# Patient Record
Sex: Female | Born: 1995
Health system: Southern US, Community
[De-identification: ages and names within clinical notes are randomized; demographics above are authoritative.]

## PROBLEM LIST (undated history)

## (undated) DIAGNOSIS — L709 Acne, unspecified: Secondary | ICD-10-CM

## (undated) DIAGNOSIS — F3281 Premenstrual dysphoric disorder: Secondary | ICD-10-CM

## (undated) DIAGNOSIS — N926 Irregular menstruation, unspecified: Secondary | ICD-10-CM

## (undated) HISTORY — DX: Irregular menstruation, unspecified: N92.6

## (undated) HISTORY — DX: Acne, unspecified: L70.9

## (undated) HISTORY — DX: Premenstrual dysphoric disorder: F32.81

---
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
## 2017-02-22 DIAGNOSIS — K080 Exfoliation of teeth due to systemic causes: Secondary | ICD-10-CM | POA: Diagnosis not present

## 2017-04-19 DIAGNOSIS — K080 Exfoliation of teeth due to systemic causes: Secondary | ICD-10-CM | POA: Diagnosis not present

## 2017-05-03 DIAGNOSIS — K080 Exfoliation of teeth due to systemic causes: Secondary | ICD-10-CM | POA: Diagnosis not present

## 2017-06-10 DIAGNOSIS — R0789 Other chest pain: Secondary | ICD-10-CM | POA: Diagnosis not present

## 2017-10-17 DIAGNOSIS — K080 Exfoliation of teeth due to systemic causes: Secondary | ICD-10-CM | POA: Diagnosis not present

## 2018-04-17 DIAGNOSIS — K080 Exfoliation of teeth due to systemic causes: Secondary | ICD-10-CM | POA: Diagnosis not present

## 2018-06-13 DIAGNOSIS — R42 Dizziness and giddiness: Secondary | ICD-10-CM | POA: Diagnosis not present

## 2018-06-13 DIAGNOSIS — R899 Unspecified abnormal finding in specimens from other organs, systems and tissues: Secondary | ICD-10-CM | POA: Diagnosis not present

## 2018-06-13 DIAGNOSIS — R6889 Other general symptoms and signs: Secondary | ICD-10-CM | POA: Diagnosis not present

## 2018-06-15 DIAGNOSIS — R42 Dizziness and giddiness: Secondary | ICD-10-CM | POA: Diagnosis not present

## 2018-06-27 DIAGNOSIS — F411 Generalized anxiety disorder: Secondary | ICD-10-CM | POA: Diagnosis not present

## 2018-06-27 DIAGNOSIS — F4011 Social phobia, generalized: Secondary | ICD-10-CM | POA: Diagnosis not present

## 2018-07-03 DIAGNOSIS — F419 Anxiety disorder, unspecified: Secondary | ICD-10-CM | POA: Diagnosis not present

## 2018-07-04 DIAGNOSIS — F419 Anxiety disorder, unspecified: Secondary | ICD-10-CM | POA: Diagnosis not present

## 2018-11-30 DIAGNOSIS — F4001 Agoraphobia with panic disorder: Secondary | ICD-10-CM | POA: Diagnosis not present

## 2018-12-08 DIAGNOSIS — F4001 Agoraphobia with panic disorder: Secondary | ICD-10-CM | POA: Diagnosis not present

## 2018-12-19 DIAGNOSIS — R112 Nausea with vomiting, unspecified: Secondary | ICD-10-CM | POA: Diagnosis not present

## 2018-12-21 LAB — BASIC METABOLIC PANEL
BUN: 15 (ref 4–21)
Creatinine: 0.6 (ref 0.5–1.1)
Glucose: 128
Potassium: 4.2 (ref 3.4–5.3)
Sodium: 142 (ref 137–147)

## 2018-12-27 DIAGNOSIS — F4001 Agoraphobia with panic disorder: Secondary | ICD-10-CM | POA: Diagnosis not present

## 2018-12-28 DIAGNOSIS — Z30011 Encounter for initial prescription of contraceptive pills: Secondary | ICD-10-CM | POA: Diagnosis not present

## 2018-12-28 DIAGNOSIS — F3281 Premenstrual dysphoric disorder: Secondary | ICD-10-CM | POA: Diagnosis not present

## 2019-01-03 ENCOUNTER — Encounter

## 2019-01-04 ENCOUNTER — Ambulatory Visit: Payer: Self-pay | Admitting: Family Medicine

## 2019-01-08 ENCOUNTER — Other Ambulatory Visit: Payer: Self-pay | Admitting: Gastroenterology

## 2019-01-08 DIAGNOSIS — R112 Nausea with vomiting, unspecified: Secondary | ICD-10-CM

## 2019-01-11 ENCOUNTER — Other Ambulatory Visit: Payer: Self-pay

## 2019-01-11 ENCOUNTER — Ambulatory Visit
Admission: RE | Admit: 2019-01-11 | Discharge: 2019-01-11 | Disposition: A | Discharge status: Home / Self Care | Payer: BLUE CROSS/BLUE SHIELD | Source: Ambulatory Visit | Attending: Gastroenterology | Admitting: Gastroenterology

## 2019-01-11 DIAGNOSIS — R112 Nausea with vomiting, unspecified: Secondary | ICD-10-CM | POA: Diagnosis not present

## 2019-01-24 DIAGNOSIS — Z3041 Encounter for surveillance of contraceptive pills: Secondary | ICD-10-CM | POA: Diagnosis not present

## 2019-01-24 DIAGNOSIS — F3281 Premenstrual dysphoric disorder: Secondary | ICD-10-CM | POA: Diagnosis not present

## 2019-02-15 ENCOUNTER — Ambulatory Visit: Payer: Self-pay | Admitting: Family Medicine

## 2019-02-19 ENCOUNTER — Encounter: Payer: Self-pay | Admitting: Family Medicine

## 2019-02-19 ENCOUNTER — Other Ambulatory Visit: Payer: Self-pay

## 2019-02-19 ENCOUNTER — Ambulatory Visit (INDEPENDENT_AMBULATORY_CARE_PROVIDER_SITE_OTHER): Payer: BLUE CROSS/BLUE SHIELD | Admitting: Family Medicine

## 2019-02-19 DIAGNOSIS — L709 Acne, unspecified: Secondary | ICD-10-CM | POA: Insufficient documentation

## 2019-02-19 DIAGNOSIS — L700 Acne vulgaris: Secondary | ICD-10-CM

## 2019-02-19 DIAGNOSIS — R5383 Other fatigue: Secondary | ICD-10-CM

## 2019-02-19 DIAGNOSIS — N926 Irregular menstruation, unspecified: Secondary | ICD-10-CM | POA: Insufficient documentation

## 2019-02-19 DIAGNOSIS — F3281 Premenstrual dysphoric disorder: Secondary | ICD-10-CM | POA: Diagnosis not present

## 2019-02-19 NOTE — Assessment & Plan Note (Signed)
Monitor for improvement on OCPs. Not bothersome to patient at this time. Consider PCOS eval; get gyn records. (denies hirsutism).

## 2019-02-19 NOTE — Assessment & Plan Note (Signed)
Improving on ocps and buspar. Will reassess in 4-6 weeks. Consider SSRI if needed.

## 2019-02-19 NOTE — Patient Instructions (Addendum)
Please return in 4-5 weeks for cpe and follow up.  It was a pleasure meeting you today! Thank you for choosing [place] to meet your healthcare needs! I truly look forward to working with you. If you have any questions or concerns, please send me a message via Mychart or call the office at [phone].

## 2019-02-19 NOTE — Progress Notes (Signed)
Virtual Visit via Video Note  Subjective  CC:  Chief Complaint  Patient presents with   Establish Care   Fatigue     I connected with Vonzella Nipple on 02/19/19 at  1:00 PM EDT by a video enabled telemedicine application and verified that I am speaking with the correct person using two identifiers. Location patient: Home Location provider: [facility] [address] Office Persons participating in the virtual visit: Marchella Albee, Willow Ora, MD Rita Ohara, CMA  I discussed the limitations of evaluation and management by telemedicine and the availability of in person appointments. The patient expressed understanding and agreed to proceed. HPI: Eliannie Geisel is a 23 y.o. female who was contacted today to address the problems listed above in the chief complaint. 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 Alita Chyle for pediatrician. Overall healthy, but over the last several months, has had increased n/v and anxiety sxs. Reports had mild sxs when she was in high school.  However she never sought treatment at that time.  More recently, she went [facility] the college clinic and the provider there diagnosed her with anxiety and prescribed BuSpar.  She is on BuSpar 10 mg twice a day and feels that it is mildly helpful.  However she is also seeing her gynecologist who feels that her symptoms are more consistent with PMDD.  She has been started on a birth control pill.  For the last 6 to 8 weeks she feels that her symptoms are mildly improved.  However the gynecologist would like her to wait a full 12-week before making any other changes.  She denies depressive symptoms.  No history of anxiety or panic attacks.  Mostly, she gets nervous and gets a nervous stomach with episodes of vomiting associated with her anxiety.  She is a [employer].  She reports a healthy home life.  She has a good  family and social network.  She denies specific stressors or triggers.  There is no family history of bipolar or depressive or anxiety symptoms.  She has never been on an SSRI for anxiety.  She has had a full gastroenterologic evaluation.  She was started on high-dose Nexium in March to see if this helps.  However she feels more strongly that her nausea and vomiting are related to anxiety and not GERD or GI related symptoms.  Health maintenance: She is due for physical exam.  She relates her immunization history is up-to-date.  We do not have those records at this time.  Depression screen [facility] 2/9 02/19/2019  Decreased Interest 0  Down, Depressed, Hopeless 0  PHQ - 2 Score 0  Altered sleeping 0  Tired, decreased energy 3  Change in appetite 0  Feeling bad or failure about yourself  0  Trouble concentrating 1  Moving slowly or fidgety/restless 0  Suicidal thoughts 0  PHQ-9 Score 4  Difficult doing work/chores Not difficult at all   GAD 7 : Generalized Anxiety Score 02/19/2019  Nervous, Anxious, on Edge 1  Control/stop worrying 2  Worry too much - different things 2  Trouble relaxing 0  Restless 0  Easily annoyed or irritable 1  Afraid - awful might happen 0  Total GAD 7 Score 6  Anxiety Difficulty Not difficult at all     Assessment  1. PMDD (premenstrual dysphoric disorder)   2. Fatigue,  unspecified type   3. Acne vulgaris   4. Irregular menstrual cycle      Plan   fatigue:  Vague; may be related to mood. Will check lab work at next visit along with physical exam and monitor once mood is well controlled. Further eval if persists. No clear etiology at this point.   Today's visit was 30 minutes long. Greater than 50% of this time was devoted to face to face counseling with the patient and coordination of care. We discussed her diagnosis, prognosis, treatment options and treatment plan is documented above and below.  See below for problem based assessment and plan documentation.    I discussed the assessment and treatment plan with the patient. The patient was provided an opportunity to ask questions and all were answered. The patient agreed with the plan and demonstrated an understanding of the instructions.   The patient was advised to call back or seek an in-person evaluation if the symptoms worsen or if the condition fails to improve as anticipated. Follow up: Return in about 4 weeks (around 03/19/2019) for complete physical, mood follow up.  Visit date not found  No orders of the defined types were placed in this encounter.     I reviewed the patients updated PMH, FH, and SocHx.    Patient Active Problem List   Diagnosis Date Noted   Acne    Irregular menstrual cycle    PMDD (premenstrual dysphoric disorder)    No outpatient medications have been marked as taking for the 02/19/19 encounter (Office Visit) with Willow OraAndy, Arlyne Brandes L, MD.    Allergies: Patient has No Known Allergies. Family History: Patient family history includes High Cholesterol in her father, maternal grandmother, and mother. Social History:  Patient  reports that she has never smoked. She has never used smokeless tobacco. She reports that she does not drink alcohol or use drugs.  Review of Systems: Constitutional: Negative for fever malaise or anorexia Cardiovascular: negative for chest pain Respiratory: negative for SOB or persistent cough Gastrointestinal: negative for abdominal pain Endocrine: negative hirsutism.   OBJECTIVE Vitals: There were no vitals taken for this visit. reports afebrile General: no acute distress , A&Ox3 Psych: normal speech and affect.  Skin: facial acne  Willow Oraamille L Akiko Schexnider, MD

## 2019-02-19 NOTE — Assessment & Plan Note (Signed)
Improving on ocps. Has GYN f/u. No AEs.

## 2019-03-19 ENCOUNTER — Ambulatory Visit: Payer: BLUE CROSS/BLUE SHIELD | Admitting: Family Medicine

## 2019-03-22 ENCOUNTER — Ambulatory Visit: Payer: BC Managed Care – PPO | Admitting: Family Medicine

## 2019-03-30 DIAGNOSIS — F3281 Premenstrual dysphoric disorder: Secondary | ICD-10-CM | POA: Diagnosis not present

## 2019-05-17 ENCOUNTER — Encounter: Payer: BLUE CROSS/BLUE SHIELD | Admitting: Family Medicine

## 2019-06-05 DIAGNOSIS — L718 Other rosacea: Secondary | ICD-10-CM | POA: Diagnosis not present

## 2019-06-05 DIAGNOSIS — L239 Allergic contact dermatitis, unspecified cause: Secondary | ICD-10-CM | POA: Diagnosis not present

## 2019-10-16 DIAGNOSIS — K582 Mixed irritable bowel syndrome: Secondary | ICD-10-CM | POA: Diagnosis not present

## 2019-10-16 DIAGNOSIS — R112 Nausea with vomiting, unspecified: Secondary | ICD-10-CM | POA: Diagnosis not present

## 2019-10-16 DIAGNOSIS — F411 Generalized anxiety disorder: Secondary | ICD-10-CM | POA: Diagnosis not present

## 2019-11-21 IMAGING — US ULTRASOUND ABDOMEN LIMITED
1 series · 14 of 25 positions shown · non-contrast
Comparison: None.

CLINICAL DATA: Nausea and vomiting over the last several weeks.

EXAM:
ULTRASOUND ABDOMEN LIMITED RIGHT UPPER QUADRANT

[Series 1: ultrasound abdomen limited · 0.19mm/px · 14 of 41 slices shown]
[im 1/41]
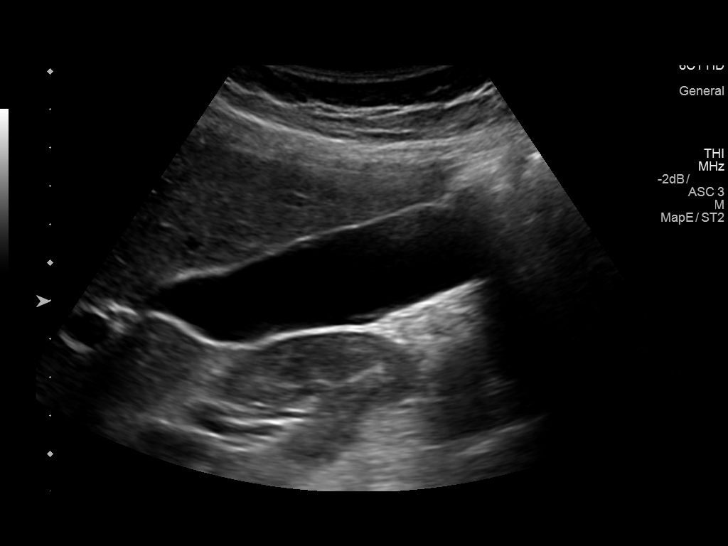
[im 4/41]
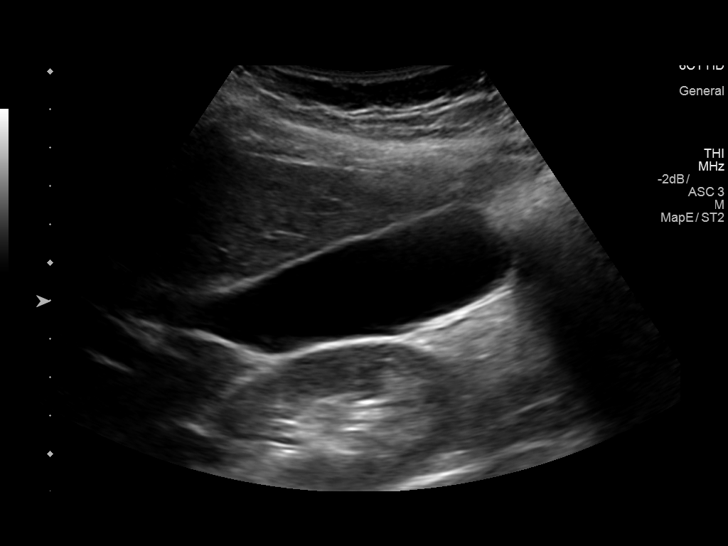
[im 7/41]
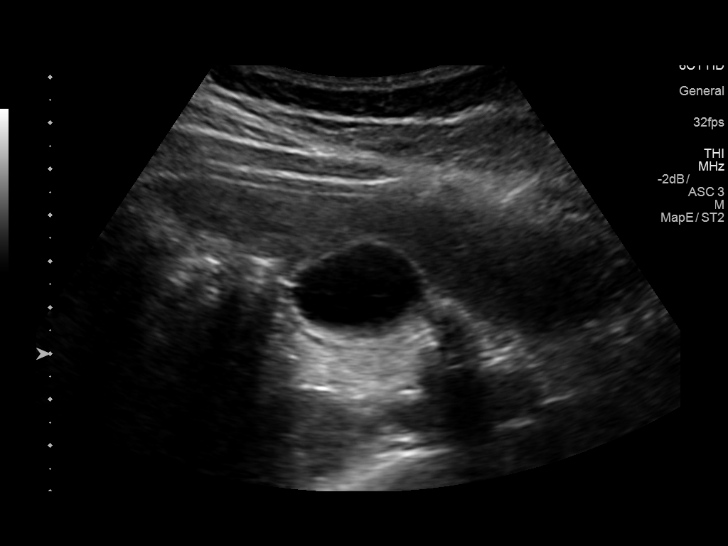
[im 11/41]
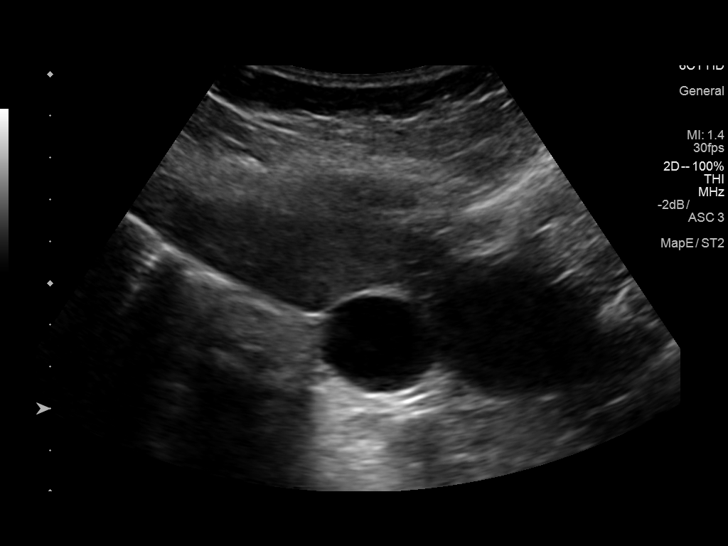
[im 14/41]
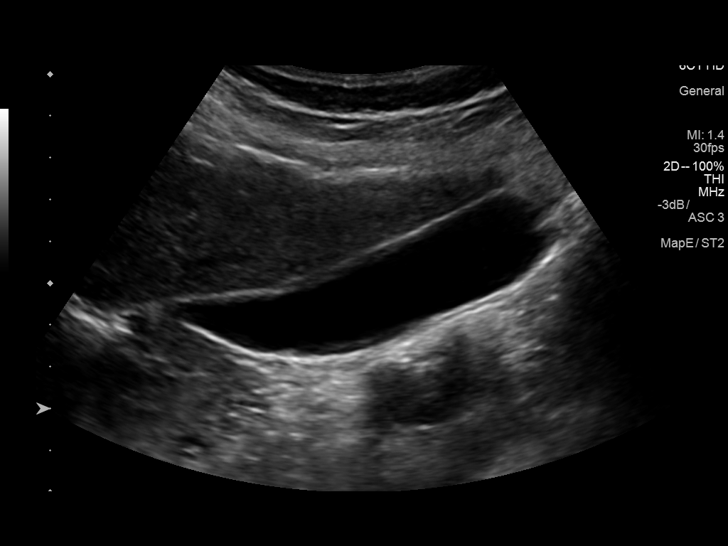
[im 16/41]
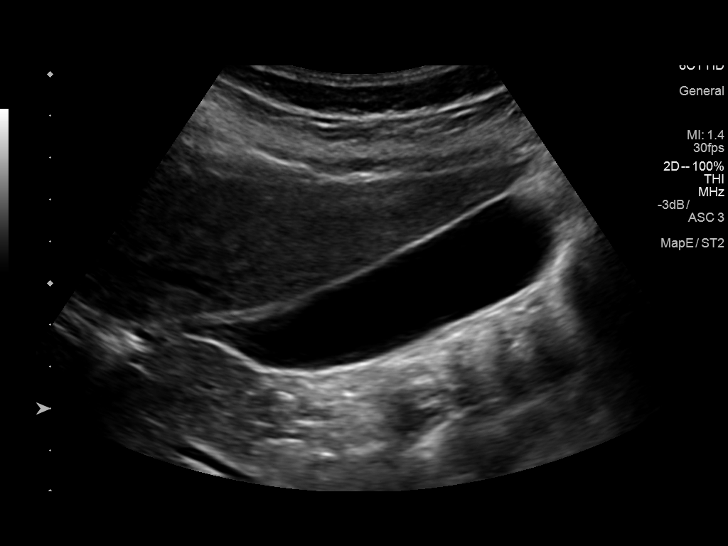
[im 19/41]
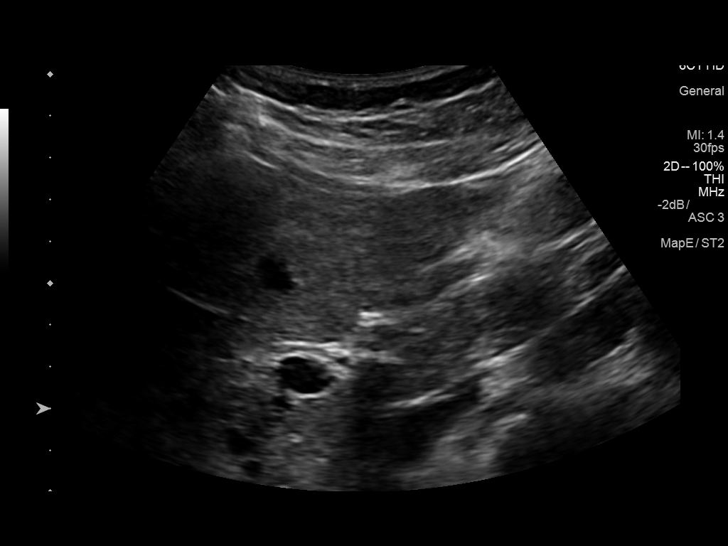
[im 22/41]
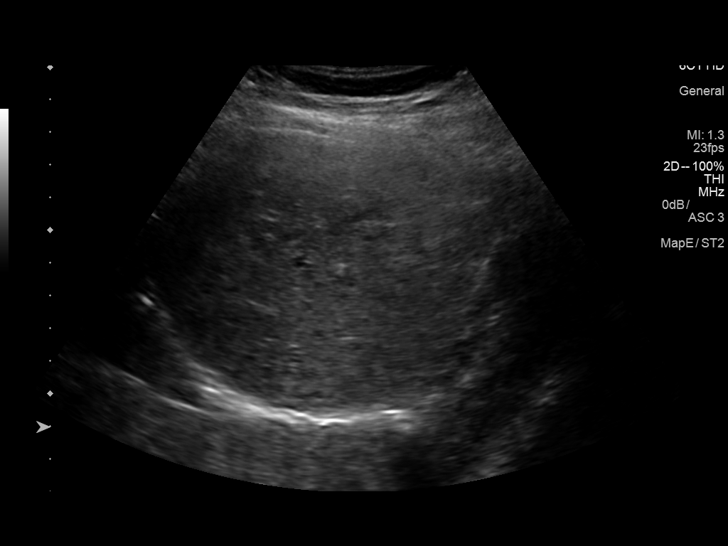
[im 26/41]
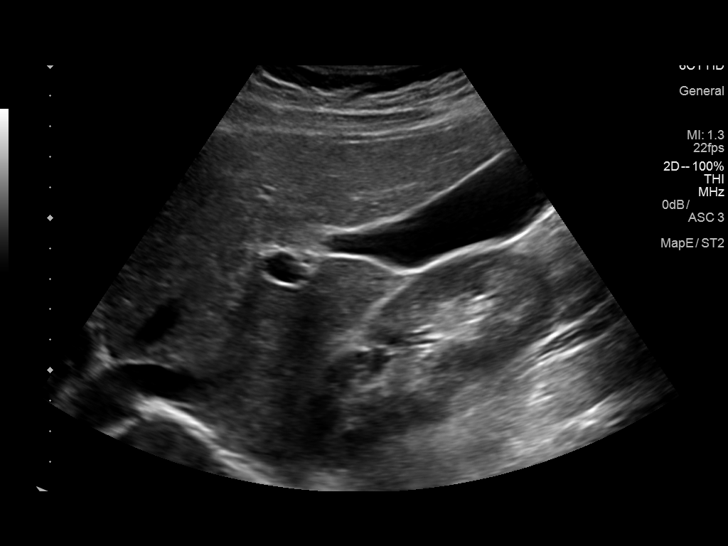
[im 27/41]
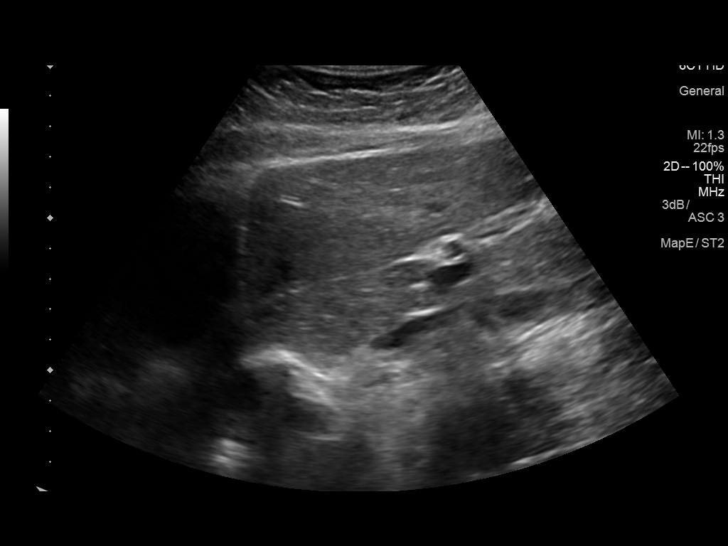
[im 31/41]
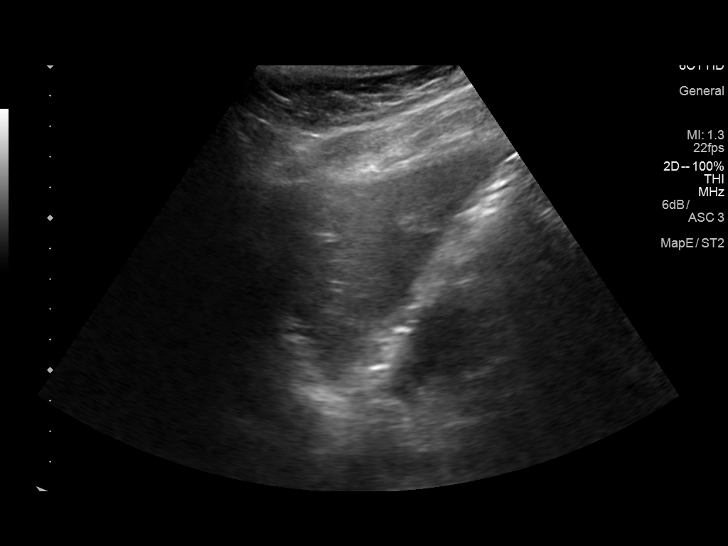
[im 34/41]
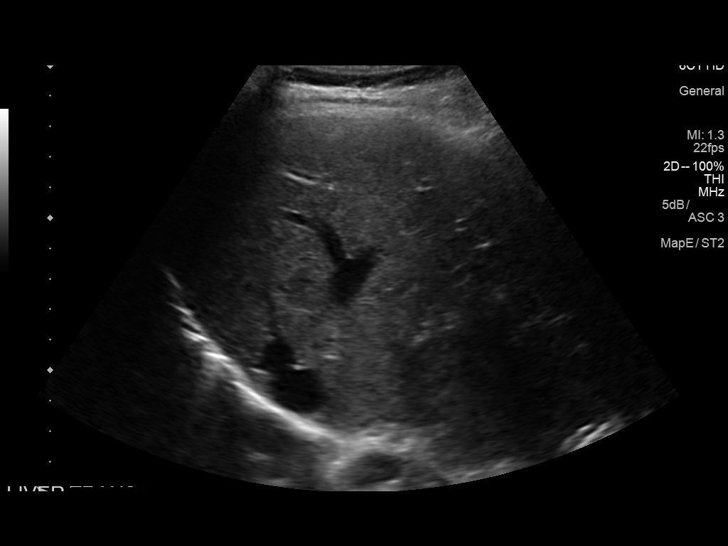
[im 37/41]
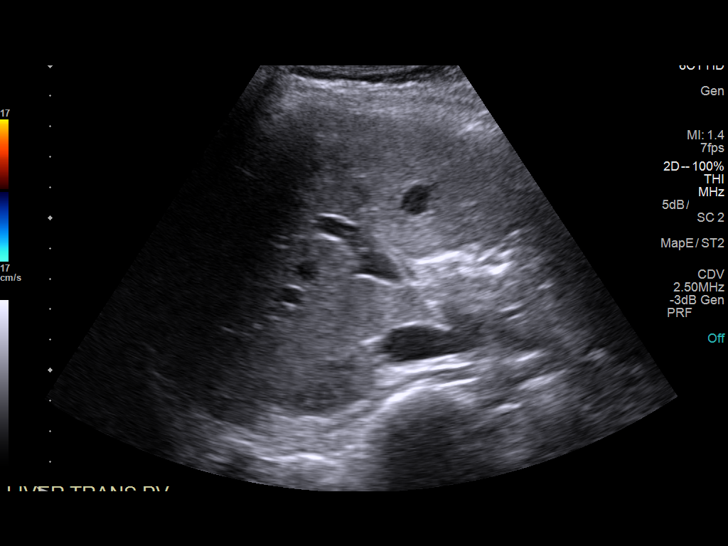
[im 41/41]
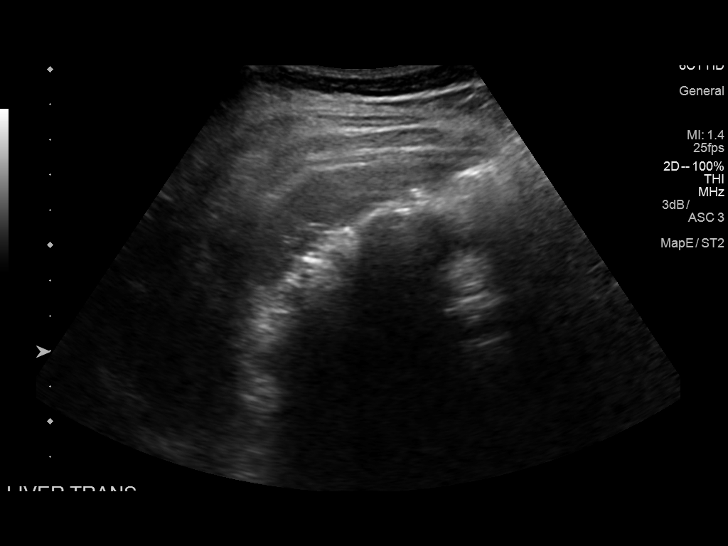

[14 of 25 positions shown; findings below may reference images not displayed]

FINDINGS: Gallbladder:

No gallstones or wall thickening visualized. No sonographic Murphy
sign noted by sonographer.

Common bile duct:

Diameter: 1.5 mm.  Normal.

Liver:

No focal lesion identified. Within normal limits in parenchymal
echogenicity. Portal vein is patent on color Doppler imaging with
normal direction of blood flow towards the liver.
IMPRESSION: Normal right upper quadrant ultrasound. No evidence of hepatobiliary
disease as a cause of the presenting symptoms.

## 2020-01-11 DIAGNOSIS — K582 Mixed irritable bowel syndrome: Secondary | ICD-10-CM | POA: Diagnosis not present

## 2020-01-11 DIAGNOSIS — R112 Nausea with vomiting, unspecified: Secondary | ICD-10-CM | POA: Diagnosis not present

## 2020-01-11 DIAGNOSIS — F411 Generalized anxiety disorder: Secondary | ICD-10-CM | POA: Diagnosis not present

## 2020-04-04 DIAGNOSIS — Z01419 Encounter for gynecological examination (general) (routine) without abnormal findings: Secondary | ICD-10-CM | POA: Diagnosis not present

## 2020-04-04 DIAGNOSIS — Z113 Encounter for screening for infections with a predominantly sexual mode of transmission: Secondary | ICD-10-CM | POA: Diagnosis not present

## 2021-01-17 ENCOUNTER — Emergency Department (HOSPITAL_COMMUNITY): Payer: BC Managed Care – PPO

## 2021-01-17 ENCOUNTER — Encounter (HOSPITAL_COMMUNITY): Payer: Self-pay

## 2021-01-17 ENCOUNTER — Emergency Department (HOSPITAL_COMMUNITY)
Admission: EM | Admit: 2021-01-17 | Discharge: 2021-01-17 | Disposition: A | Discharge status: Home / Self Care | Payer: BC Managed Care – PPO | Attending: Emergency Medicine | Admitting: Emergency Medicine

## 2021-01-17 ENCOUNTER — Other Ambulatory Visit: Payer: Self-pay

## 2021-01-17 DIAGNOSIS — E876 Hypokalemia: Secondary | ICD-10-CM | POA: Insufficient documentation

## 2021-01-17 DIAGNOSIS — R112 Nausea with vomiting, unspecified: Secondary | ICD-10-CM | POA: Insufficient documentation

## 2021-01-17 DIAGNOSIS — R102 Pelvic and perineal pain: Secondary | ICD-10-CM | POA: Diagnosis not present

## 2021-01-17 DIAGNOSIS — M545 Low back pain, unspecified: Secondary | ICD-10-CM | POA: Diagnosis not present

## 2021-01-17 DIAGNOSIS — R1031 Right lower quadrant pain: Secondary | ICD-10-CM | POA: Diagnosis not present

## 2021-01-17 DIAGNOSIS — R109 Unspecified abdominal pain: Secondary | ICD-10-CM

## 2021-01-17 LAB — COMPREHENSIVE METABOLIC PANEL
ALT: 15 U/L (ref 0–44)
AST: 17 U/L (ref 15–41)
Albumin: 3.8 g/dL (ref 3.5–5.0)
Alkaline Phosphatase: 33 U/L — ABNORMAL LOW (ref 38–126)
Anion gap: 10 (ref 5–15)
BUN: 12 mg/dL (ref 6–20)
CO2: 22 mmol/L (ref 22–32)
Calcium: 9 mg/dL (ref 8.9–10.3)
Chloride: 103 mmol/L (ref 98–111)
Creatinine, Ser: 0.66 mg/dL (ref 0.44–1.00)
GFR, Estimated: >60 mL/min (ref >60)
Glucose, Bld: 107 mg/dL — ABNORMAL HIGH (ref 70–99)
Potassium: 3.3 mmol/L — ABNORMAL LOW (ref 3.5–5.1)
Sodium: 135 mmol/L (ref 135–145)
Total Bilirubin: 0.2 mg/dL — ABNORMAL LOW (ref 0.3–1.2)
Total Protein: 6.6 g/dL (ref 6.5–8.1)

## 2021-01-17 LAB — CBC
HCT: 37.7 % (ref 36.0–46.0)
Hemoglobin: 12.6 g/dL (ref 12.0–15.0)
MCH: 30 pg (ref 26.0–34.0)
MCHC: 33.4 g/dL (ref 30.0–36.0)
MCV: 89.8 fL (ref 80.0–100.0)
Platelets: 254 10*3/uL (ref 150–400)
RBC: 4.2 MIL/uL (ref 3.87–5.11)
RDW: 12.4 % (ref 11.5–15.5)
WBC: 7.6 10*3/uL (ref 4.0–10.5)
nRBC: 0 % (ref 0.0–0.2)

## 2021-01-17 LAB — URINALYSIS, ROUTINE W REFLEX MICROSCOPIC
Bacteria, UA: NONE SEEN
Bilirubin Urine: NEGATIVE
Glucose, UA: NEGATIVE mg/dL
Ketones, ur: NEGATIVE mg/dL
Leukocytes,Ua: NEGATIVE
Nitrite: NEGATIVE
Protein, ur: NEGATIVE mg/dL
Specific Gravity, Urine: 1.01 (ref 1.005–1.030)
pH: 8 (ref 5.0–8.0)

## 2021-01-17 LAB — I-STAT BETA HCG BLOOD, ED (MC, WL, AP ONLY): I-stat hCG, quantitative: <5 m[IU]/mL (ref <5)

## 2021-01-17 LAB — LIPASE, BLOOD: Lipase: 36 U/L (ref 11–51)

## 2021-01-17 NOTE — ED Triage Notes (Signed)
Pt reports pain to right flank and right lower abdomen. Reports vomited x 2. Started at 0430 am. Denies urinary frequency or burning with urination.

## 2021-01-17 NOTE — ED Provider Notes (Signed)
[facility]   CSN: [id number] Arrival date & time: 01/17/21  0449     History No chief complaint on file.   Brianna ShanksMaris Jodelle Singh is a 25 y.o. female.  Patient presents to the emergency department today for evaluation of right lower quadrant and right lower back pain.  Patient woke from sleep at approximately 4:30 AM with severe pain in her side.  She was extremely uncomfortable, pain was severe, and she vomited twice.  She woke her mother who accompanies her today.  Patient states that about an hour ago her symptoms have resolved, except for a few very brief twinges of pain.  No urinary changes or symptoms.  No vaginal bleeding or discharge.  No chest pain, fever or shortness of breath.  Patient has never had a kidney stone or any surgeries on her abdomen.  No treatments prior to arrival.  The pain was so severe she did think Tylenol would help. The onset of this condition was acute. The course is constant. Aggravating factors: none. Alleviating factors: none.          Past Medical History:  Diagnosis Date  . Acne   . Irregular menstrual cycle   . PMDD (premenstrual dysphoric disorder)     Patient Active Problem List   Diagnosis Date Noted  . Acne   . Irregular menstrual cycle   . PMDD (premenstrual dysphoric disorder)     History reviewed. No pertinent surgical history.   OB History   No obstetric history on file.     Family History  Problem Relation Age of Onset  . High Cholesterol Mother   . High Cholesterol Father   . Hypertension Maternal Grandmother   . Hypertension Maternal Grandfather   . Depression Neg Hx   . Bipolar disorder Neg Hx     Social History   Tobacco Use  . Smoking status: Never Smoker  . Smokeless tobacco: Never Used  Vaping Use  . Vaping Use: Never used  Substance Use Topics  . Alcohol use: Never  . Drug use: Never    Home Medications Prior to Admission medications   Medication Sig  Start Date End Date Taking? Authorizing Provider  busPIRone (BUSPAR) 10 MG tablet TAKE 1 TABLET EVERY AM & 1/2 TABLET IN THE EVENING FOR 1 WEEK, THEN INCREASE TO 1 TABLET TWICE DAILY 01/12/19   [provider]  esomeprazole (NEXIUM) 40 MG capsule TAKE 1 CAPSULE BY MOUTH EVERY DAY AS DIRECTED 02/03/19   [provider]  LO LOESTRIN FE 1 MG-10 MCG / 10 MCG tablet Take 1 tablet by mouth daily. 12/28/18   [provider]    Allergies    Other  Review of Systems   Review of Systems  Constitutional: Negative for fever.  HENT: Negative for rhinorrhea and sore throat.   Eyes: Negative for redness.  Respiratory: Negative for cough.   Cardiovascular: Negative for chest pain.  Gastrointestinal: Positive for abdominal pain, nausea and vomiting. Negative for diarrhea.  Genitourinary: Positive for flank pain. Negative for dysuria, frequency, hematuria and urgency.  Musculoskeletal: Negative for myalgias.  Skin: Negative for rash.  Neurological: Negative for headaches.    Physical Exam Updated Vital Signs BP (!) 132/91   Pulse 81   Temp 97.6 F (36.4 C) (Oral)   Resp 17   Ht 5\' 5"  (1.651 m)   Wt 59 kg   LMP 01/03/2021 (Approximate)   SpO2 100%   BMI 21.63 kg/m   Physical  Exam Vitals and nursing note reviewed.  Constitutional:      General: She is not in acute distress.    Appearance: She is well-developed.  HENT:     Head: Normocephalic and atraumatic.     Right Ear: External ear normal.     Left Ear: External ear normal.     Nose: Nose normal.  Eyes:     Conjunctiva/sclera: Conjunctivae normal.  Cardiovascular:     Rate and Rhythm: Normal rate and regular rhythm.     Heart sounds: No murmur heard.   Pulmonary:     Effort: No respiratory distress.     Breath sounds: No wheezing, rhonchi or rales.  Abdominal:     Palpations: Abdomen is soft.     Tenderness: There is no abdominal tenderness. There is right CVA tenderness (mild). There is no guarding or  rebound.     Comments: Patient reports symptoms have resolved.  No abdominal pain at time of exam.  Musculoskeletal:     Cervical back: Normal range of motion and neck supple.     Right lower leg: No edema.     Left lower leg: No edema.  Skin:    General: Skin is warm and dry.     Findings: No rash.  Neurological:     General: No focal deficit present.     Mental Status: She is alert. Mental status is at baseline.     Motor: No weakness.  Psychiatric:        Mood and Affect: Mood normal.     ED Results / Procedures / Treatments   Labs (all labs ordered are listed, but only abnormal results are displayed) Labs Reviewed  COMPREHENSIVE METABOLIC PANEL - Abnormal; Notable for the following components:      Result Value   Potassium 3.3 (*)    Glucose, Bld 107 (*)    Alkaline Phosphatase 33 (*)    Total Bilirubin 0.2 (*)    All other components within normal limits  URINALYSIS, ROUTINE W REFLEX MICROSCOPIC - Abnormal; Notable for the following components:   APPearance HAZY (*)    Hgb urine dipstick LARGE (*)    All other components within normal limits  LIPASE, BLOOD  CBC  I-STAT BETA HCG BLOOD, ED ([facility], [facility], [facility] ONLY)    EKG None  Radiology US Pelvis Complete  Result Date: 01/17/2021 CLINICAL DATA:  Pelvic pain EXAM: TRANSABDOMINAL ULTRASOUND OF PELVIS DOPPLER ULTRASOUND OF OVARIES TECHNIQUE: Transabdominal ultrasound examination of the pelvis was performed including evaluation of the uterus, ovaries, adnexal regions, and pelvic cul-de-sac. Patient declined transvaginal study. Color and duplex Doppler ultrasound was utilized to evaluate blood flow to the ovaries. COMPARISON:  None. FINDINGS: Uterus Measurements: 7.7 x 2.7 x 4.1 cm = volume: 44.5 mL. No fibroids or other mass visualized. Endometrium Thickness: 5 mm.  No focal abnormality visualized. Right ovary Measurements: 3.7 x 1.8 x 2.1 cm = volume: 7.1 mL. Normal appearance/no adnexal mass. Left ovary Measurements: 2.5 x 1.4 x  2.3 cm = volume: 4.9 mL. Normal appearance/no adnexal mass. Pulsed Doppler evaluation demonstrates normal low-resistance arterial and venous waveforms in both ovaries. Other: No evident free fluid. IMPRESSION: No intrauterine or extrauterine pelvic or adnexal mass. No appreciable free fluid. Low resistance waveform in each ovary. No findings indicative of ovarian torsion on either side. Electronically Signed   By: Bretta Bang III M.D.   On: 01/17/2021 10:18   [place] Art/Ven Flow Abd Pelv Doppler  Result Date: 01/17/2021 CLINICAL DATA:  Pelvic pain EXAM: TRANSABDOMINAL ULTRASOUND OF PELVIS DOPPLER ULTRASOUND OF OVARIES TECHNIQUE: Transabdominal ultrasound examination of the pelvis was performed including evaluation of the uterus, ovaries, adnexal regions, and pelvic cul-de-sac. Patient declined transvaginal study. Color and duplex Doppler ultrasound was utilized to evaluate blood flow to the ovaries. COMPARISON:  None. FINDINGS: Uterus Measurements: 7.7 x 2.7 x 4.1 cm = volume: 44.5 mL. No fibroids or other mass visualized. Endometrium Thickness: 5 mm.  No focal abnormality visualized. Right ovary Measurements: 3.7 x 1.8 x 2.1 cm = volume: 7.1 mL. Normal appearance/no adnexal mass. Left ovary Measurements: 2.5 x 1.4 x 2.3 cm = volume: 4.9 mL. Normal appearance/no adnexal mass. Pulsed Doppler evaluation demonstrates normal low-resistance arterial and venous waveforms in both ovaries. Other: No evident free fluid. IMPRESSION: No intrauterine or extrauterine pelvic or adnexal mass. No appreciable free fluid. Low resistance waveform in each ovary. No findings indicative of ovarian torsion on either side. Electronically Signed   By: Bretta Bang III M.D.   On: 01/17/2021 10:18    Procedures Procedures   Medications Ordered in ED Medications - No data to display  ED Course  I have reviewed the triage vital signs and the nursing notes.  Pertinent labs & imaging results that were available during my  care of the patient were reviewed by me and considered in my medical decision making (see chart for details).  Patient seen and examined. Work-up initiated.  Symptoms are much better controlled at this time, spontaneously.  Considered ureteral colic, ovarian cyst rupture, ovarian torsion is most likely possibilities.  Also considered appendicitis and musculoskeletal pain however felt less likely.  Vital signs reviewed and are as follows: BP (!) 132/91   Pulse 81   Temp 97.6 F (36.4 C) (Oral)   Resp 17   Ht 5\' 5"  (1.651 m)   Wt 59 kg   LMP 01/03/2021 (Approximate)   SpO2 100%   BMI 21.63 kg/m   8:55 AM Blood in UA -- however patient reports some breakthrough vaginal bleeding noticed when she gave urine sample. So unclear etiology of bleeding.   Discussed options at this point.  Her pain remains resolved.  Discussed pelvic ultrasound to evaluate for signs of ovarian cyst and rule out current torsion, although with out pain currently, torsion is unlikely.  Also discussed symptom control and watchful waiting and follow-up, with possible need to return if symptoms recur.  Patient elects to proceed with ultrasonography.  10:31 AM patient continues to be pain-free and looks very comfortable.  Updated patient and mother on results of ultrasonography.  At this point, feel that she is stable for discharge to home.  Will be given a strainer to use for a few days to catch any possible sediment.  Encourage PCP follow-up next week, especially if pain is recurrent or persist.  Encouraged use of Tylenol or NSAIDs for pain.  The patient was urged to return to the Emergency Department immediately with worsening of current symptoms, worsening abdominal pain, persistent vomiting, blood noted in stools, fever, or any other concerns. The patient verbalized understanding.     MDM Rules/Calculators/A&P                          Patient presents today after having an acute onset of right lower abdominal pain.   Symptoms were severe and associated with vomiting.  Fortunately lab work-up is reassuring with only mild hypokalemia.  Normal white blood cell count.  Urine with blood,  but possibly contaminated.  No signs of infection and symptoms would be unusual for pyelonephritis.  Evaluated for signs of ovarian cyst or torsion, imaging negative.  Other potential etiologies include ureteral colic, intestinal spasm, musculoskeletal pain.  At this point, no indication of a condition which require admission to the hospital.  Patient looks well, pain resolved.  She is comfortable with close monitoring at home and follow-up as needed.  Return instructions as above.   Final Clinical Impression(s) / ED Diagnoses Final diagnoses:  Right sided abdominal pain    Rx / DC Orders ED Discharge Orders    None       Renne Crigler, PA-C 01/17/21 1035    Margarita Grizzle, MD 01/17/21 [phone]

## 2021-01-17 NOTE — Discharge Instructions (Signed)
Please read and follow all provided instructions.  Your diagnoses today include:  1. Right sided abdominal pain     Tests performed today include:  Blood cell counts and platelets  Kidney and liver function tests - slightly low potassium  Urine test to look for infection - no sign of infection, there is blood but unclear if this is contamination or if it is a sign of a kidney stone  A blood or urine test for pregnancy (women only)  Vital signs. See below for your results today.   Medications prescribed:  Please use over-the-counter NSAID medications (ibuprofen, naproxen) as directed on the packaging for pain if you do not have any reasons not to take these medications just as weak kidneys or a history of bleeding in your stomach or gut.   Take any prescribed medications only as directed.  Home care instructions:   Follow any educational materials contained in this packet.  Follow-up instructions: Please follow-up with your primary care provider in the next 3 days for further evaluation of your symptoms.    Return instructions:  SEEK IMMEDIATE MEDICAL ATTENTION IF:  The pain does not go away or becomes severe   A temperature above 101F develops   Repeated vomiting occurs (multiple episodes)   The pain becomes localized to portions of the abdomen. The right side could possibly be appendicitis. In an adult, the left lower portion of the abdomen could be colitis or diverticulitis.   Blood is being passed in stools or vomit (bright red or black tarry stools)   You develop chest pain, difficulty breathing, dizziness or fainting, or become confused, poorly responsive, or inconsolable (young children)  If you have any other emergent concerns regarding your health  Additional Information: Abdominal (belly) pain can be caused by many things. Your caregiver performed an examination and possibly ordered blood/urine tests and imaging (CT scan, x-rays, ultrasound). Many cases can be  observed and treated at home after initial evaluation in the emergency department. Even though you are being discharged home, abdominal pain can be unpredictable. Therefore, you need a repeated exam if your pain does not resolve, returns, or worsens. Most patients with abdominal pain don't have to be admitted to the hospital or have surgery, but serious problems like appendicitis and gallbladder attacks can start out as nonspecific pain. Many abdominal conditions cannot be diagnosed in one visit, so follow-up evaluations are very important.  Your vital signs today were: BP (!) 127/100   Pulse 84   Temp 97.6 F (36.4 C) (Oral)   Resp 17   Ht 5\' 5"  (1.651 m)   Wt 59 kg   LMP 01/03/2021 (Approximate)   SpO2 99%   BMI 21.63 kg/m  If your blood pressure (bp) was elevated above 135/85 this visit, please have this repeated by your doctor within one month. --------------

## 2021-01-17 NOTE — ED Notes (Signed)
Pt currently drinking water at this time.

## 2021-01-17 NOTE — ED Notes (Signed)
Patient transported to Ultrasound 

## 2021-04-07 DIAGNOSIS — Z01419 Encounter for gynecological examination (general) (routine) without abnormal findings: Secondary | ICD-10-CM | POA: Diagnosis not present

## 2021-04-07 DIAGNOSIS — Z113 Encounter for screening for infections with a predominantly sexual mode of transmission: Secondary | ICD-10-CM | POA: Diagnosis not present

## 2021-10-22 IMAGING — US US PELVIS COMPLETE
1 series · 14 of 25 positions shown · non-contrast
Comparison: None.

CLINICAL DATA: Pelvic pain

EXAM:
TRANSABDOMINAL ULTRASOUND OF PELVIS
DOPPLER ULTRASOUND OF OVARIES
TECHNIQUE: Transabdominal ultrasound examination of the pelvis was performed
including evaluation of the uterus, ovaries, adnexal regions, and
pelvic cul-de-sac. Patient declined transvaginal study.
Color and duplex Doppler ultrasound was utilized to evaluate blood
flow to the ovaries.

[Series 1: us pelvis (transabdominal only) · 14 of 43 slices shown]
[im 1/43]
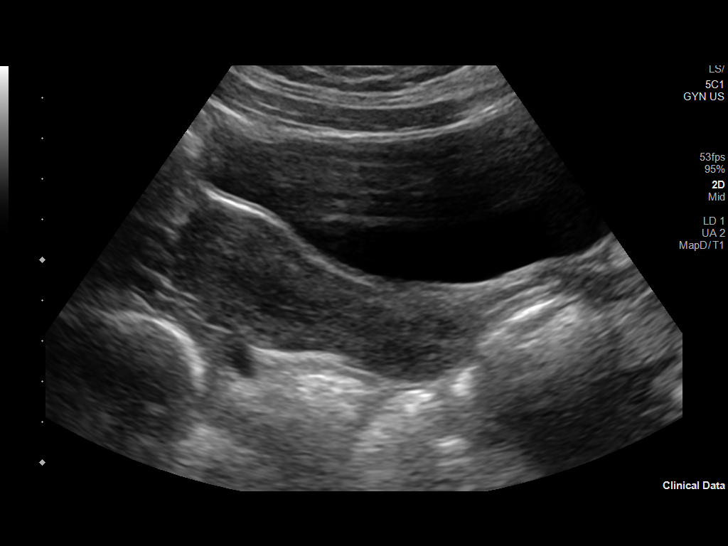
[im 4/43]
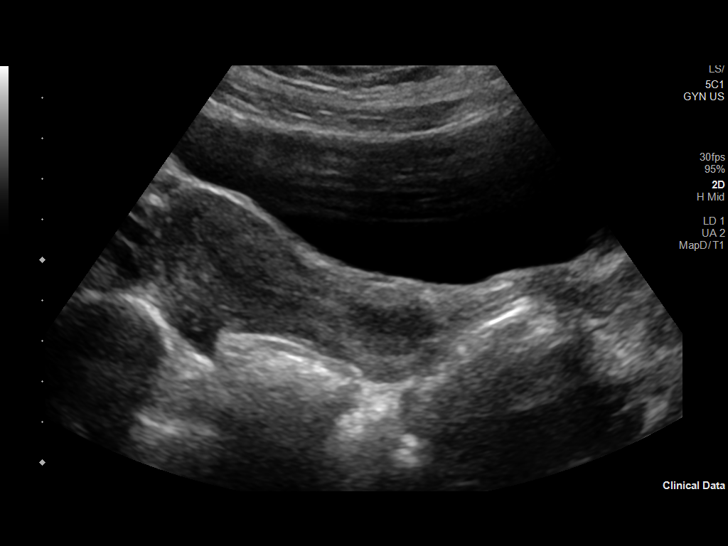
[im 8/43]
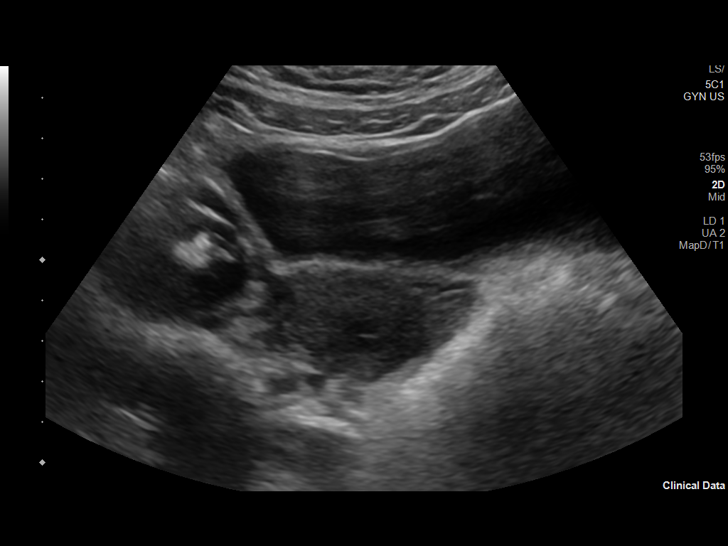
[im 11/43]
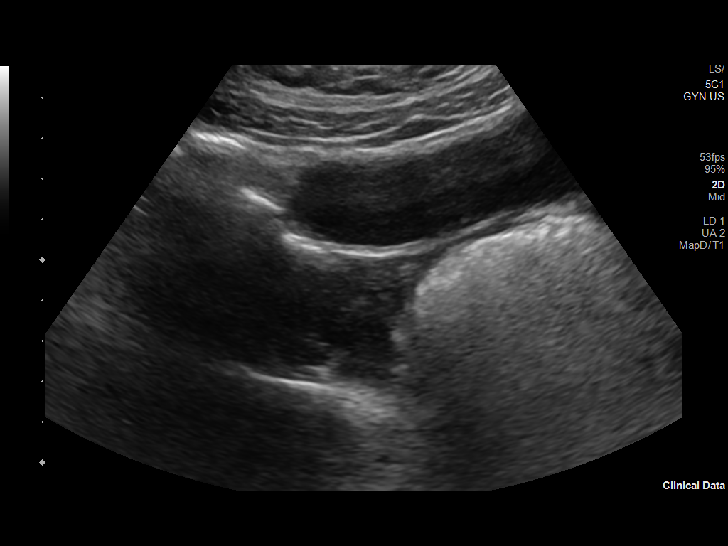
[im 15/43]
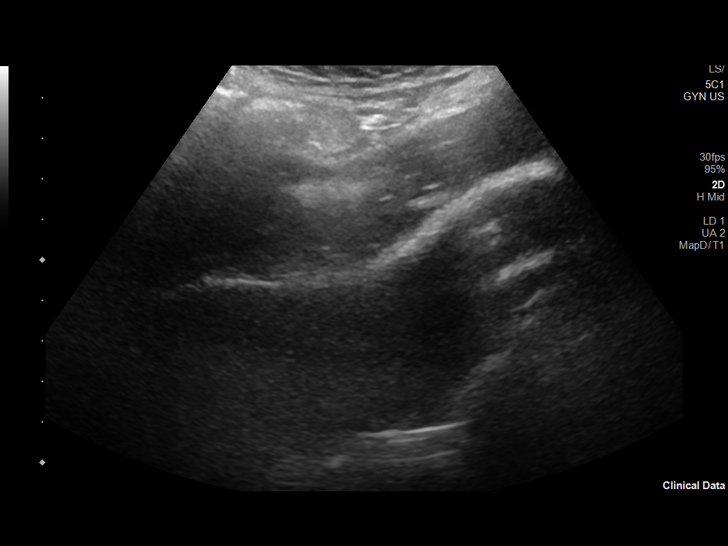
[im 16/43]
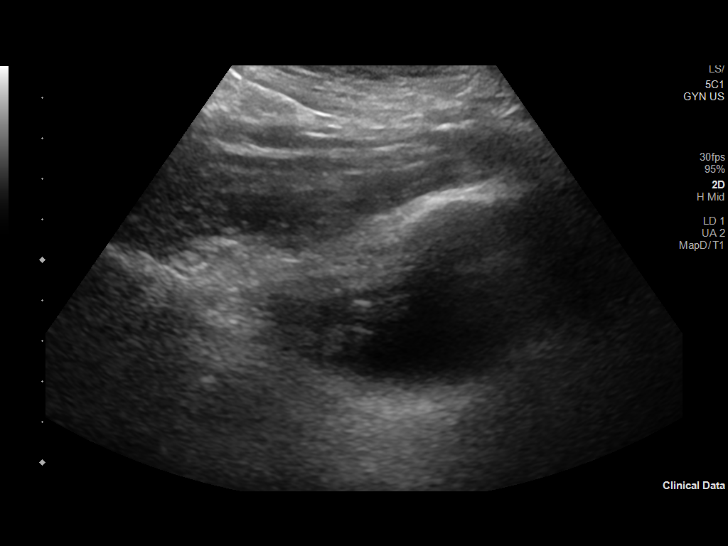
[im 20/43]
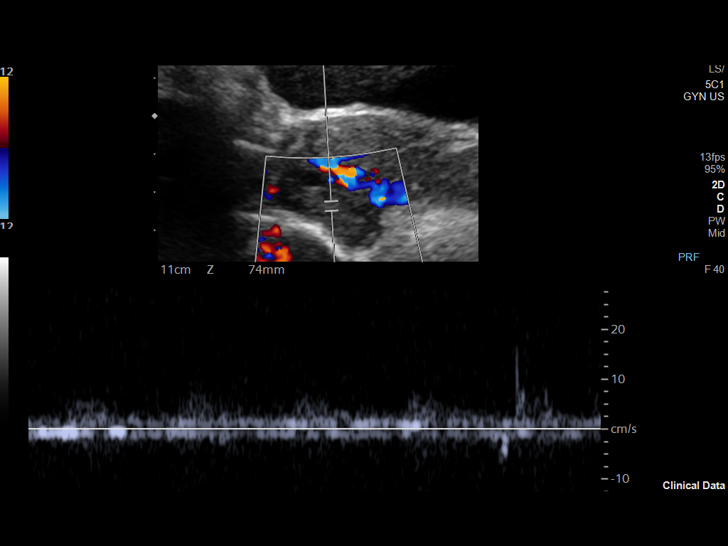
[im 23/43]
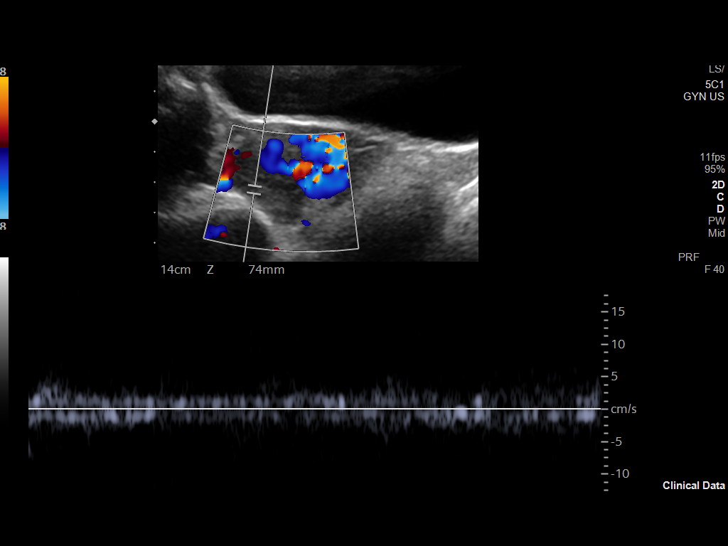
[im 27/43]
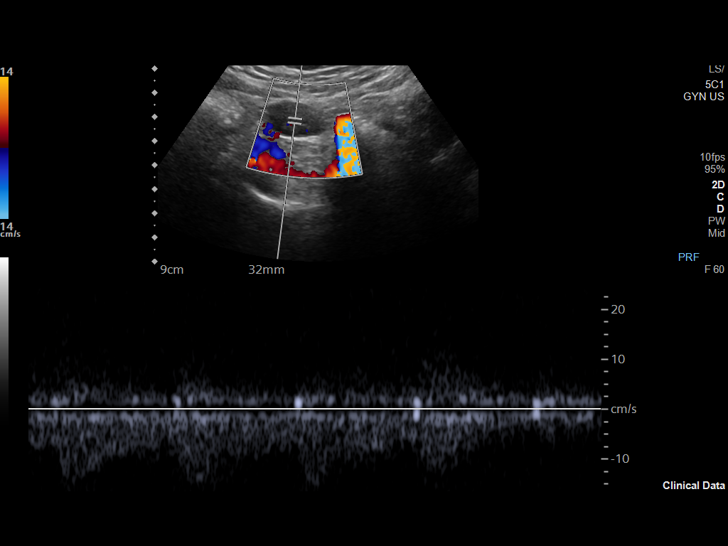
[im 29/43]
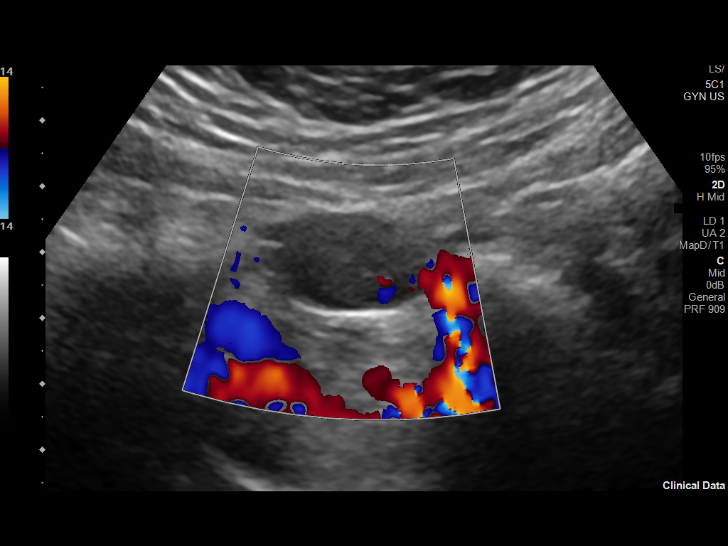
[im 32/43]
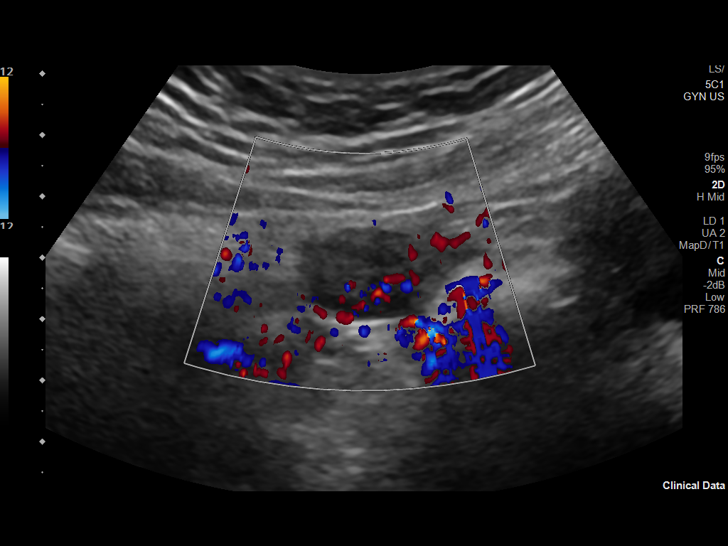
[im 36/43]
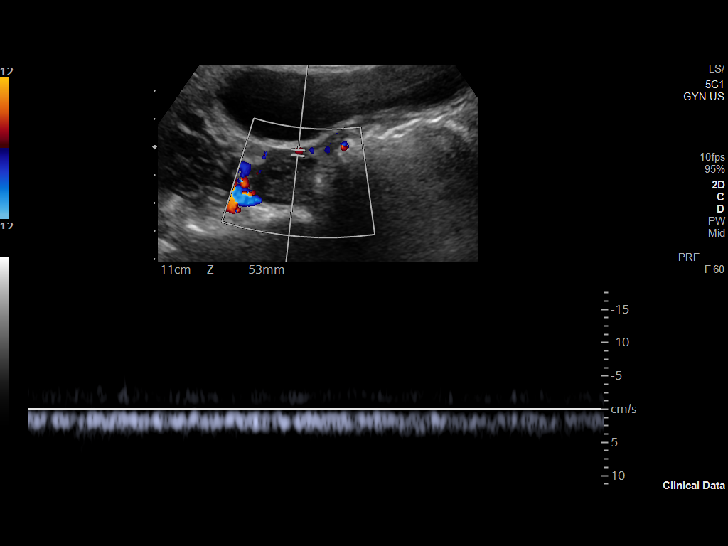
[im 39/43]
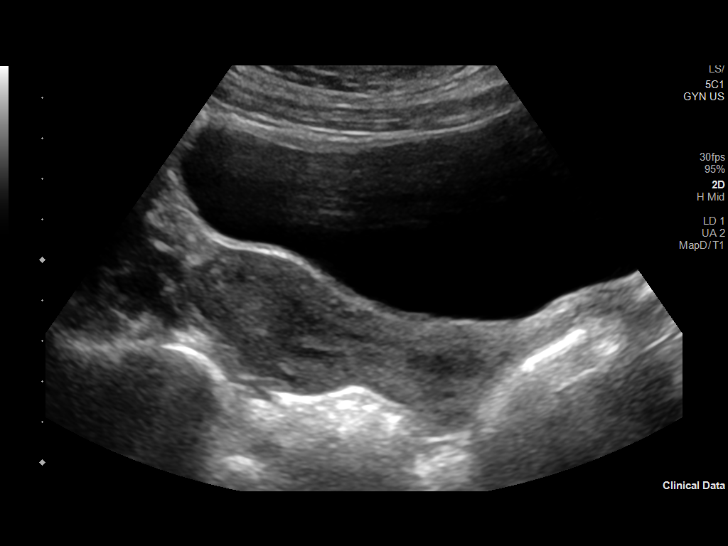
[im 43/43]
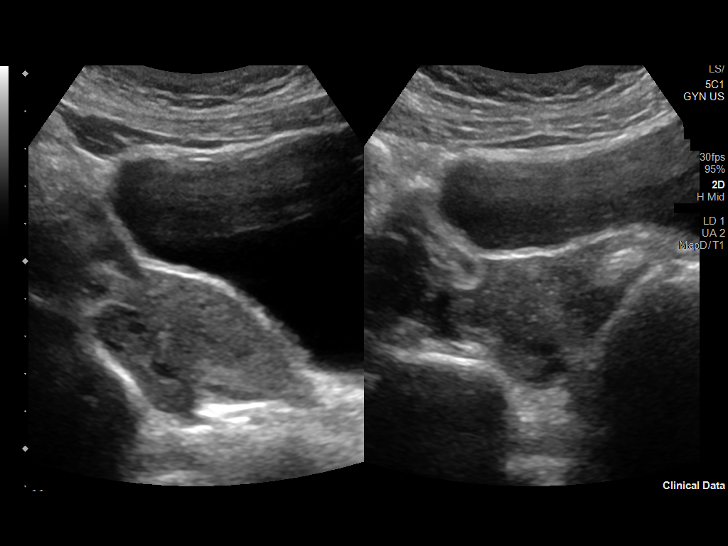

[14 of 25 positions shown; findings below may reference images not displayed]

FINDINGS: Uterus

Measurements: 7.7 x 2.7 x 4.1 cm = volume: 44.5 mL. No fibroids or
other mass visualized.

Endometrium

Thickness: 5 mm.  No focal abnormality visualized.

Right ovary

Measurements: 3.7 x 1.8 x 2.1 cm = volume: 7.1 mL. Normal
appearance/no adnexal mass.

Left ovary

Measurements: 2.5 x 1.4 x 2.3 cm = volume: 4.9 mL. Normal
appearance/no adnexal mass.

Pulsed Doppler evaluation demonstrates normal low-resistance
arterial and venous waveforms in both ovaries.

Other: No evident free fluid.
IMPRESSION: No intrauterine or extrauterine pelvic or adnexal mass. No
appreciable free fluid.

Low resistance waveform in each ovary. No findings indicative of
ovarian torsion on either side.

## 2022-05-20 DIAGNOSIS — R1084 Generalized abdominal pain: Secondary | ICD-10-CM | POA: Diagnosis not present

## 2022-06-14 DIAGNOSIS — R0789 Other chest pain: Secondary | ICD-10-CM | POA: Diagnosis not present

## 2022-06-17 DIAGNOSIS — F419 Anxiety disorder, unspecified: Secondary | ICD-10-CM | POA: Diagnosis not present

## 2022-06-17 DIAGNOSIS — R0602 Shortness of breath: Secondary | ICD-10-CM | POA: Diagnosis not present

## 2022-06-17 DIAGNOSIS — R0789 Other chest pain: Secondary | ICD-10-CM | POA: Diagnosis not present

## 2022-06-17 DIAGNOSIS — R110 Nausea: Secondary | ICD-10-CM | POA: Diagnosis not present

## 2022-06-17 DIAGNOSIS — R079 Chest pain, unspecified: Secondary | ICD-10-CM | POA: Diagnosis not present

## 2022-06-21 DIAGNOSIS — R079 Chest pain, unspecified: Secondary | ICD-10-CM | POA: Diagnosis not present

## 2022-06-21 DIAGNOSIS — Z3041 Encounter for surveillance of contraceptive pills: Secondary | ICD-10-CM | POA: Diagnosis not present

## 2022-06-21 DIAGNOSIS — Z01419 Encounter for gynecological examination (general) (routine) without abnormal findings: Secondary | ICD-10-CM | POA: Diagnosis not present

## 2022-06-21 DIAGNOSIS — G43829 Menstrual migraine, not intractable, without status migrainosus: Secondary | ICD-10-CM | POA: Diagnosis not present

## 2022-06-21 DIAGNOSIS — Z113 Encounter for screening for infections with a predominantly sexual mode of transmission: Secondary | ICD-10-CM | POA: Diagnosis not present

## 2022-06-21 DIAGNOSIS — Z13220 Encounter for screening for lipoid disorders: Secondary | ICD-10-CM | POA: Diagnosis not present

## 2022-07-29 DIAGNOSIS — R0789 Other chest pain: Secondary | ICD-10-CM | POA: Diagnosis not present

## 2022-07-29 DIAGNOSIS — Z6821 Body mass index (BMI) 21.0-21.9, adult: Secondary | ICD-10-CM | POA: Diagnosis not present

## 2022-07-29 DIAGNOSIS — F411 Generalized anxiety disorder: Secondary | ICD-10-CM | POA: Diagnosis not present

## 2022-10-13 DIAGNOSIS — R0789 Other chest pain: Secondary | ICD-10-CM | POA: Diagnosis not present

## 2022-11-02 DIAGNOSIS — R0789 Other chest pain: Secondary | ICD-10-CM | POA: Diagnosis not present

## 2022-11-03 DIAGNOSIS — R0789 Other chest pain: Secondary | ICD-10-CM | POA: Diagnosis not present

## 2023-12-12 ENCOUNTER — Other Ambulatory Visit: Payer: Self-pay | Admitting: Nurse Practitioner

## 2023-12-12 ENCOUNTER — Other Ambulatory Visit (HOSPITAL_COMMUNITY)
Admission: RE | Admit: 2023-12-12 | Discharge: 2023-12-12 | Disposition: A | Discharge status: Home / Self Care | Source: Ambulatory Visit | Attending: Nurse Practitioner | Admitting: Nurse Practitioner

## 2023-12-12 DIAGNOSIS — Z13228 Encounter for screening for other metabolic disorders: Secondary | ICD-10-CM | POA: Diagnosis not present

## 2023-12-12 DIAGNOSIS — Z1329 Encounter for screening for other suspected endocrine disorder: Secondary | ICD-10-CM | POA: Diagnosis not present

## 2023-12-12 DIAGNOSIS — Z01419 Encounter for gynecological examination (general) (routine) without abnormal findings: Secondary | ICD-10-CM | POA: Diagnosis not present

## 2023-12-12 DIAGNOSIS — F418 Other specified anxiety disorders: Secondary | ICD-10-CM | POA: Diagnosis not present

## 2023-12-12 DIAGNOSIS — Z124 Encounter for screening for malignant neoplasm of cervix: Secondary | ICD-10-CM | POA: Insufficient documentation

## 2023-12-12 DIAGNOSIS — E7800 Pure hypercholesterolemia, unspecified: Secondary | ICD-10-CM | POA: Diagnosis not present

## 2023-12-12 DIAGNOSIS — Z3041 Encounter for surveillance of contraceptive pills: Secondary | ICD-10-CM | POA: Diagnosis not present

## 2023-12-15 LAB — CYTOLOGY - PAP: Diagnosis: NEGATIVE
